# Patient Record
Sex: Female | Born: 2000 | Race: Black or African American | Hispanic: No | Smoking: Never smoker
Health system: Southern US, Community
[De-identification: ages and names within clinical notes are randomized; demographics above are authoritative.]

## PROBLEM LIST (undated history)

## (undated) ENCOUNTER — Inpatient Hospital Stay: Payer: Self-pay

## (undated) DIAGNOSIS — Z789 Other specified health status: Secondary | ICD-10-CM

---
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
## 2019-09-23 ENCOUNTER — Encounter: Payer: Self-pay | Admitting: Intensive Care

## 2019-09-23 ENCOUNTER — Emergency Department
Admission: EM | Admit: 2019-09-23 | Discharge: 2019-09-23 | Disposition: A | Discharge status: Home / Self Care | Payer: Medicaid Other | Attending: Emergency Medicine | Admitting: Emergency Medicine

## 2019-09-23 ENCOUNTER — Emergency Department: Payer: Medicaid Other

## 2019-09-23 ENCOUNTER — Other Ambulatory Visit: Payer: Self-pay

## 2019-09-23 DIAGNOSIS — M79672 Pain in left foot: Secondary | ICD-10-CM | POA: Diagnosis not present

## 2019-09-23 DIAGNOSIS — M79605 Pain in left leg: Secondary | ICD-10-CM

## 2019-09-23 DIAGNOSIS — R2242 Localized swelling, mass and lump, left lower limb: Secondary | ICD-10-CM | POA: Insufficient documentation

## 2019-09-23 LAB — COMPREHENSIVE METABOLIC PANEL
ALT: 14 U/L (ref 0–44)
AST: 12 U/L — ABNORMAL LOW (ref 15–41)
Albumin: 4.1 g/dL (ref 3.5–5.0)
Alkaline Phosphatase: 32 U/L — ABNORMAL LOW (ref 38–126)
Anion gap: 5 (ref 5–15)
BUN: 19 mg/dL (ref 6–20)
CO2: 26 mmol/L (ref 22–32)
Calcium: 9 mg/dL (ref 8.9–10.3)
Chloride: 105 mmol/L (ref 98–111)
Creatinine, Ser: 0.72 mg/dL (ref 0.44–1.00)
GFR calc Af Amer: >60 mL/min (ref >60)
GFR calc non Af Amer: >60 mL/min (ref >60)
Glucose, Bld: 100 mg/dL — ABNORMAL HIGH (ref 70–99)
Potassium: 3.7 mmol/L (ref 3.5–5.1)
Sodium: 136 mmol/L (ref 135–145)
Total Bilirubin: 0.5 mg/dL (ref 0.3–1.2)
Total Protein: 6.9 g/dL (ref 6.5–8.1)

## 2019-09-23 LAB — CBC WITH DIFFERENTIAL/PLATELET
Abs Immature Granulocytes: 0.02 10*3/uL (ref 0.00–0.07)
Basophils Absolute: 0.1 10*3/uL (ref 0.0–0.1)
Basophils Relative: 1 %
Eosinophils Absolute: 0.5 10*3/uL (ref 0.0–0.5)
Eosinophils Relative: 7 %
HCT: 35.3 % — ABNORMAL LOW (ref 36.0–46.0)
Hemoglobin: 11.9 g/dL — ABNORMAL LOW (ref 12.0–15.0)
Immature Granulocytes: 0 %
Lymphocytes Relative: 36 %
Lymphs Abs: 2.5 10*3/uL (ref 0.7–4.0)
MCH: 31.2 pg (ref 26.0–34.0)
MCHC: 33.7 g/dL (ref 30.0–36.0)
MCV: 92.7 fL (ref 80.0–100.0)
Monocytes Absolute: 0.6 10*3/uL (ref 0.1–1.0)
Monocytes Relative: 9 %
Neutro Abs: 3.3 10*3/uL (ref 1.7–7.7)
Neutrophils Relative %: 47 %
Platelets: 183 10*3/uL (ref 150–400)
RBC: 3.81 MIL/uL — ABNORMAL LOW (ref 3.87–5.11)
RDW: 12.7 % (ref 11.5–15.5)
WBC: 6.8 10*3/uL (ref 4.0–10.5)
nRBC: 0 % (ref 0.0–0.2)

## 2019-09-23 LAB — POCT PREGNANCY, URINE: Preg Test, Ur: NEGATIVE

## 2019-09-23 MED ORDER — CEPHALEXIN 250 MG/5ML PO SUSR: 500.0000 mg | Freq: Three times a day (TID) | ORAL | 0 refills | Status: AC

## 2019-09-23 NOTE — ED Provider Notes (Signed)
[facility] Emergency Department Provider Note  ____________________________________________   First MD Initiated Contact with Patient 09/23/19 1720     (approximate)  I have reviewed the triage vital signs and the nursing notes.   HISTORY  Chief Complaint Foot Pain (left)    HPI Talaysha Sabrina Alvarado is a 19 y.o. female presents emergency department complaining of left foot pain for a few days.  States areas been very swollen.  No known injury.  No sores.  No fever or chills.  Patient does take birth control pills.  Non-smoker.   Pain is rated at 5/10   History reviewed. No pertinent past medical history.  There are no problems to display for this patient.   History reviewed. No pertinent surgical history.  Prior to Admission medications   Not on File    Allergies Patient has no known allergies.  Family History  Problem Relation Age of Onset  . Scoliosis Mother   . Healthy Father     Social History Social History   Tobacco Use  . Smoking status: Never Smoker  . Smokeless tobacco: Never Used  Vaping Use  . Vaping Use: Never used  Substance Use Topics  . Alcohol use: Never  . Drug use: Never    Review of Systems  Constitutional: No fever/chills Eyes: No visual changes. ENT: No sore throat. Respiratory: Denies cough Cardiovascular: Denies chest pain Gastrointestinal: Denies abdominal pain Genitourinary: Negative for dysuria. Musculoskeletal: Negative for back pain.  Swelling of the left foot Skin: Negative for rash. Psychiatric: no mood changes,     ____________________________________________   PHYSICAL EXAM:  VITAL SIGNS: ED Triage Vitals  Enc Vitals Group     BP 09/23/19 1704 118/75     Pulse Rate 09/23/19 1704 92     Resp 09/23/19 1704 20     Temp 09/23/19 1704 98.5 F (36.9 C)     Temp Source 09/23/19 1704 Oral     SpO2 09/23/19 1704 100 %     Weight 09/23/19 1704 90 lb (40.8 kg)     Height 09/23/19 1704 5\' 3"   (1.6 m)     Head Circumference --      Peak Flow --      Pain Score 09/23/19 1710 5     Pain Loc --      Pain Edu? --      Excl. in GC? --     Constitutional: Alert and oriented. Well appearing and in no acute distress. Eyes: Conjunctivae are normal.  Head: Atraumatic. Nose: No congestion/rhinnorhea. Mouth/Throat: Mucous membranes are moist.   Neck:  supple no lymphadenopathy noted Cardiovascular: Normal rate, regular rhythm.  Respiratory: Normal respiratory effort.  No retractions,  GU: deferred Musculoskeletal: FROM all extremities, warm and well perfused, left foot has soft tissue swelling, some redness noted along the top with a mild streak coming up the lower leg, neurovascular is intact Neurologic:  Normal speech and language.  Skin:  Skin is warm, dry and intact. No rash noted. Psychiatric: Mood and affect are normal. Speech and behavior are normal.  ____________________________________________   LABS (all labs ordered are listed, but only abnormal results are displayed)  Labs Reviewed  POC URINE PREG, ED   ____________________________________________   ____________________________________________  RADIOLOGY  X-ray of the left foot is negative for fracture, foreign body, does show soft tissue swelling Ultrasound of the left lower extremity to rule out DVT  ____________________________________________   PROCEDURES  Procedure(s) performed: No  Procedures  ____________________________________________   INITIAL IMPRESSION / ASSESSMENT AND PLAN / ED COURSE  Pertinent labs & imaging results that were available during my care of the patient were reviewed by me and considered in my medical decision making (see chart for details).   Patient is a 19 year old female presents emergency department with left foot pain and swelling.  See HPI Physical exam does show soft tissue swelling with the fair amount of redness of the left foot with a small streak.  Calf is  not tender, great saphenous vein area of the left upper thigh is tender  Patient's x-ray of her left foot is negative for fracture or foreign body.  It does show soft tissue swelling  Ultrasound venous left lower extremity  Patient care transferred to [facility], PA-C.  She was instructed that the ultrasound will be performed.  If negative treat the patient for cellulitis.   Chanon Loney was evaluated in Emergency Department on 09/23/2019 for the symptoms described in the history of present illness. She was evaluated in the context of the global COVID-19 pandemic, which necessitated consideration that the patient might be at risk for infection with the SARS-CoV-2 virus that causes COVID-19. Institutional protocols and algorithms that pertain to the evaluation of patients at risk for COVID-19 are in a state of rapid change based on information released by regulatory bodies including the CDC and federal and state organizations. These policies and algorithms were followed during the patient's care in the ED.   As part of my medical decision making, I reviewed the following data within the [place] History obtained from family, Nursing notes reviewed and incorporated, Old chart reviewed, Radiograph reviewed , Notes from prior ED visits and [place] Controlled Substance Database  ____________________________________________   FINAL CLINICAL IMPRESSION(S) / ED DIAGNOSES  Final diagnoses:  Foot pain, left  Pain of left lower extremity      NEW MEDICATIONS STARTED DURING THIS VISIT:  New Prescriptions   No medications on file     Note:  This document was prepared using Dragon voice recognition software and may include unintentional dictation errors.    Versie Starks, PA-C 09/23/19 Arther Dames, MD 09/25/19 Natasha Mead

## 2019-09-23 NOTE — ED Triage Notes (Signed)
PAtient c/o left foot pain for a few days. Denies injury

## 2019-09-23 NOTE — Discharge Instructions (Signed)
Take Keflex three times daily for the next week.  

## 2021-05-16 ENCOUNTER — Ambulatory Visit
Admission: EM | Admit: 2021-05-16 | Discharge: 2021-05-16 | Disposition: A | Discharge status: Home / Self Care | Payer: Medicaid Other

## 2021-05-16 ENCOUNTER — Encounter: Payer: Self-pay | Admitting: Emergency Medicine

## 2021-05-16 ENCOUNTER — Other Ambulatory Visit: Payer: Self-pay

## 2021-05-16 ENCOUNTER — Ambulatory Visit (INDEPENDENT_AMBULATORY_CARE_PROVIDER_SITE_OTHER): Payer: Medicaid Other

## 2021-05-16 DIAGNOSIS — S161XXA Strain of muscle, fascia and tendon at neck level, initial encounter: Secondary | ICD-10-CM | POA: Diagnosis not present

## 2021-05-16 DIAGNOSIS — S8001XA Contusion of right knee, initial encounter: Secondary | ICD-10-CM

## 2021-05-16 DIAGNOSIS — R519 Headache, unspecified: Secondary | ICD-10-CM

## 2021-05-16 DIAGNOSIS — S29019A Strain of muscle and tendon of unspecified wall of thorax, initial encounter: Secondary | ICD-10-CM

## 2021-05-16 DIAGNOSIS — S39012A Strain of muscle, fascia and tendon of lower back, initial encounter: Secondary | ICD-10-CM | POA: Diagnosis not present

## 2021-05-16 MED ORDER — IBUPROFEN 600 MG PO TABS: 600.0000 mg | ORAL_TABLET | Freq: Four times a day (QID) | ORAL | 0 refills | Status: DC | PRN

## 2021-05-16 MED ORDER — BACLOFEN 10 MG PO TABS: 10.0000 mg | ORAL_TABLET | Freq: Three times a day (TID) | ORAL | 0 refills | Status: DC

## 2021-05-16 NOTE — Discharge Instructions (Addendum)
Take the ibuprofen, 600 mg every 6 hours with food, on a schedule for the next 48 hours and then as needed.  Take the baclofen, 10 mg every 8 hours, on a schedule for the next 48 hours and then as needed.  Apply moist heat to your back/neck for 30 minutes at a time 2-3 times a day to improve blood flow to the area and help remove the lactic acid causing the spasm.  Follow the back/neck exercises given at discharge.  The ibuprofen should also help your headache and knee pain.  Return for reevaluation for any new or worsening symptoms.
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
## 2022-08-28 ENCOUNTER — Ambulatory Visit
Admission: RE | Admit: 2022-08-28 | Discharge: 2022-08-28 | Disposition: A | Discharge status: Home / Self Care | Payer: Medicaid Other | Source: Ambulatory Visit | Attending: Nurse Practitioner | Admitting: Nurse Practitioner

## 2022-08-28 ENCOUNTER — Other Ambulatory Visit: Payer: Self-pay

## 2022-08-28 DIAGNOSIS — M79642 Pain in left hand: Secondary | ICD-10-CM | POA: Diagnosis present

## 2023-02-14 ENCOUNTER — Other Ambulatory Visit: Payer: Self-pay

## 2023-02-14 ENCOUNTER — Ambulatory Visit: Payer: Self-pay

## 2023-02-14 ENCOUNTER — Emergency Department
Admission: EM | Admit: 2023-02-14 | Discharge: 2023-02-14 | Disposition: A | Discharge status: Home / Self Care | Payer: Medicaid Other | Attending: Emergency Medicine | Admitting: Emergency Medicine

## 2023-02-14 DIAGNOSIS — N939 Abnormal uterine and vaginal bleeding, unspecified: Secondary | ICD-10-CM | POA: Diagnosis present

## 2023-02-14 DIAGNOSIS — N750 Cyst of Bartholin's gland: Secondary | ICD-10-CM | POA: Insufficient documentation

## 2023-02-14 LAB — URINALYSIS, ROUTINE W REFLEX MICROSCOPIC
Bilirubin Urine: NEGATIVE
Glucose, UA: NEGATIVE mg/dL
Ketones, ur: 80 mg/dL — AB
Nitrite: NEGATIVE
Protein, ur: 100 mg/dL — AB
Specific Gravity, Urine: 1.031 — ABNORMAL HIGH (ref 1.005–1.030)
pH: 5 (ref 5.0–8.0)

## 2023-02-14 LAB — BASIC METABOLIC PANEL
Anion gap: 8 (ref 5–15)
BUN: 17 mg/dL (ref 6–20)
CO2: 25 mmol/L (ref 22–32)
Calcium: 9.1 mg/dL (ref 8.9–10.3)
Chloride: 101 mmol/L (ref 98–111)
Creatinine, Ser: 0.66 mg/dL (ref 0.44–1.00)
GFR, Estimated: >60 mL/min (ref >60)
Glucose, Bld: 117 mg/dL — ABNORMAL HIGH (ref 70–99)
Potassium: 3.8 mmol/L (ref 3.5–5.1)
Sodium: 134 mmol/L — ABNORMAL LOW (ref 135–145)

## 2023-02-14 LAB — CBC WITH DIFFERENTIAL/PLATELET
Abs Immature Granulocytes: 0.09 10*3/uL — ABNORMAL HIGH (ref 0.00–0.07)
Basophils Absolute: 0 10*3/uL (ref 0.0–0.1)
Basophils Relative: 0 %
Eosinophils Absolute: 0 10*3/uL (ref 0.0–0.5)
Eosinophils Relative: 0 %
HCT: 38.6 % (ref 36.0–46.0)
Hemoglobin: 13 g/dL (ref 12.0–15.0)
Immature Granulocytes: 1 %
Lymphocytes Relative: 6 %
Lymphs Abs: 1.1 10*3/uL (ref 0.7–4.0)
MCH: 30.9 pg (ref 26.0–34.0)
MCHC: 33.7 g/dL (ref 30.0–36.0)
MCV: 91.7 fL (ref 80.0–100.0)
Monocytes Absolute: 1.3 10*3/uL — ABNORMAL HIGH (ref 0.1–1.0)
Monocytes Relative: 8 %
Neutro Abs: 14 10*3/uL — ABNORMAL HIGH (ref 1.7–7.7)
Neutrophils Relative %: 85 %
Platelets: 206 10*3/uL (ref 150–400)
RBC: 4.21 MIL/uL (ref 3.87–5.11)
RDW: 12.4 % (ref 11.5–15.5)
WBC: 16.5 10*3/uL — ABNORMAL HIGH (ref 4.0–10.5)
nRBC: 0 % (ref 0.0–0.2)

## 2023-02-14 LAB — CHLAMYDIA/NGC RT PCR (ARMC ONLY)
Chlamydia Tr: NOT DETECTED
N gonorrhoeae: NOT DETECTED

## 2023-02-14 LAB — WET PREP, GENITAL
Clue Cells Wet Prep HPF POC: NONE SEEN
Sperm: NONE SEEN
Trich, Wet Prep: NONE SEEN
WBC, Wet Prep HPF POC: <10 (ref <10)
Yeast Wet Prep HPF POC: NONE SEEN

## 2023-02-14 LAB — POC URINE PREG, ED: Preg Test, Ur: NEGATIVE

## 2023-02-14 MED ORDER — DOXYCYCLINE HYCLATE 100 MG PO TABS
100.0000 mg | ORAL_TABLET | Freq: Once | ORAL | Status: AC
  Administered 2023-02-14: 100 mg via ORAL
  Filled 2023-02-14: qty 1

## 2023-02-14 MED ORDER — CEFTRIAXONE SODIUM 1 G IJ SOLR
500.0000 mg | Freq: Once | INTRAMUSCULAR | Status: AC
Start: 2023-02-14 — End: 2023-02-14
  Administered 2023-02-14: 500 mg via INTRAMUSCULAR
  Filled 2023-02-14: qty 10

## 2023-02-14 MED ORDER — DOXYCYCLINE HYCLATE 50 MG PO CAPS: 100.0000 mg | ORAL_CAPSULE | Freq: Two times a day (BID) | ORAL | 0 refills | Status: AC

## 2023-02-14 NOTE — ED Provider Notes (Signed)
[facility] Provider Note    Event Date/Time   First MD Initiated Contact with Patient 02/14/23 1210     (approximate)   History   Vaginal Bleeding   HPI Sabrina Alvarado is a 22 y.o. female presenting today for vaginal bleeding and swelling.  Patient states she had sexual intercourse several days ago.  She started noticing swelling and pain around her vagina 2 days ago.  Today she noticed an episode of vaginal bleeding which quickly resolved.  Last menstrual cycle ended 7 days ago.  Denies any other abdominal pain, nausea, vomiting, vaginal discharge, dysuria, hematuria.  Does not use oral contraceptives or condoms.  No prior history of STIs.     Physical Exam   Triage Vital Signs: ED Triage Vitals [02/14/23 1156]  Encounter Vitals Group     BP 116/86     Systolic BP Percentile      Diastolic BP Percentile      Pulse Rate (!) 110     Resp 18     Temp 98 F (36.7 C)     Temp src      SpO2 100 %     Weight 93 lb (42.2 kg)     Height 5\' 4"  (1.626 m)     Head Circumference      Peak Flow      Pain Score 0     Pain Loc      Pain Education      Exclude from Growth Chart     Most recent vital signs: Vitals:   02/14/23 1156  BP: 116/86  Pulse: (!) 110  Resp: 18  Temp: 98 F (36.7 C)  SpO2: 100%   I have reviewed the vital signs. General:  Awake, alert, no acute distress. Head:  Normocephalic, Atraumatic. EENT:  PERRL, EOMI, Oral mucosa pink and moist, Neck is supple. Cardiovascular: Regular rate, 2+ distal pulses. Respiratory:  Normal respiratory effort, symmetrical expansion, no distress.   Abdomen: Soft, nontender, non-distended GU: Pelvic exam showing swelling to the right labia.  Slight cystic structure noted that actively drained on exam.  Noted slight bruising to the right labia as well.  No other intravaginal trauma noted.  No other vaginal bleeding present. Extremities:  Moving all four extremities through full ROM without pain.    Neuro:  Alert and oriented.  Interacting appropriately.   Skin:  Warm, dry, no rash.   Psych: Appropriate affect.    ED Results / Procedures / Treatments   Labs (all labs ordered are listed, but only abnormal results are displayed) Labs Reviewed  CBC WITH DIFFERENTIAL/PLATELET - Abnormal; Notable for the following components:      Result Value   WBC 16.5 (*)    Neutro Abs 14.0 (*)    Monocytes Absolute 1.3 (*)    Abs Immature Granulocytes 0.09 (*)    All other components within normal limits  BASIC METABOLIC PANEL - Abnormal; Notable for the following components:   Sodium 134 (*)    Glucose, Bld 117 (*)    All other components within normal limits  URINALYSIS, ROUTINE W REFLEX MICROSCOPIC - Abnormal; Notable for the following components:   Color, Urine YELLOW (*)    APPearance HAZY (*)    Specific Gravity, Urine 1.031 (*)    Hgb urine dipstick MODERATE (*)    Ketones, ur 80 (*)    Protein, ur 100 (*)    Leukocytes,Ua MODERATE (*)    Bacteria, UA RARE (*)  All other components within normal limits  WET PREP, GENITAL  CHLAMYDIA/NGC RT PCR ([facility] ONLY)            POC URINE PREG, ED     EKG    RADIOLOGY    PROCEDURES:  Critical Care performed: No  Pelvic exam  Date/Time: 02/14/2023 1:17 PM  Performed by: Janith Lima, MD Authorized by: Janith Lima, MD  Consent: Verbal consent obtained. Risks and benefits: risks, benefits and alternatives were discussed Consent given by: patient Patient identity confirmed: verbally with patient Time out: Immediately prior to procedure a "time out" was called to verify the correct patient, procedure, equipment, support staff and site/side marked as required. Preparation: Patient was prepped and draped in the usual sterile fashion. Local anesthesia used: no  Anesthesia: Local anesthesia used: no Patient tolerance: patient tolerated the procedure well with no immediate complications Comments: See physical exam for  findings on pelvic exam      MEDICATIONS ORDERED IN ED: Medications  cefTRIAXone (ROCEPHIN) injection 500 mg (has no administration in time range)  doxycycline (VIBRA-TABS) tablet 100 mg (has no administration in time range)     IMPRESSION / MDM / ASSESSMENT AND PLAN / ED COURSE  I reviewed the triage vital signs and the nursing notes.                              Differential diagnosis includes, but is not limited to, UTI, Bartholin cyst, vaginal trauma, vaginal laceration  Patient's presentation is most consistent with acute complicated illness / injury requiring diagnostic workup.  Patient is a 22 year old female presenting today for vaginal trauma and bleeding.  Swelling noted to the right labial region with bruising present.  There was a small cyst which was actively draining on pelvic exam.  No other intravaginal trauma noted.  I do think swelling is likely related to recent vaginal trauma.  No bleeding on the exam at this time.  Pregnancy test negative.  Will empirically treat for STIs as patient does not want to wait for those results.  She was told to follow-up with her gynecologist within the next 5 days for reassessment and potential further management of cystic structure is getting larger again after it has been draining appropriately today.  The patient is on the cardiac monitor to evaluate for evidence of arrhythmia and/or significant heart rate changes. Clinical Course as of 02/14/23 1344  Sun Feb 14, 2023  1342 Urinalysis, Routine w reflex microscopic -Urine, Clean Catch(!) Many squames present along with blood and drainage coming from the vagina.  Will not treat for UTI at this time. [DW]    Clinical Course User Index [DW] Janith Lima, MD     FINAL CLINICAL IMPRESSION(S) / ED DIAGNOSES   Final diagnoses:  Bartholin cyst     Rx / DC Orders   ED Discharge Orders          Ordered    Ambulatory referral to Gynecology       Comments: Vaginal trauma.   Possible Bartholin cyst   02/14/23 1343    doxycycline (VIBRAMYCIN) 50 MG capsule  2 times daily        02/14/23 1344             Note:  This document was prepared using Dragon voice recognition software and may include unintentional dictation errors.   Janith Lima, MD 02/14/23 [id number]

## 2023-02-14 NOTE — Discharge Instructions (Addendum)
I have sent antibiotics to your pharmacy for you to take as prescribed.  Please follow-up with a gynecologist within the next 5 days for reassessment.

## 2023-02-14 NOTE — ED Triage Notes (Signed)
Pt comes with c/o some vaginal bleeding. Pt stats she did have intercourse 2 days ago. Pt states it has since been swollen down there and painful. Pt states the bleeding has stopped and pain has subsided. Pt denies any urinary symptoms.

## 2023-04-07 NOTE — L&D Delivery Note (Signed)
 Delivery Note  Sabrina Alvarado is a G3P0020 at [redacted]w[redacted]d with an LMP of 03/11/23, not consistent with US  at [redacted]w[redacted]d.   First Stage: Labor onset: 12/05/23 @ 1918 Augmentation: oxytocin  and AROM Analgesia Holli intrapartum: Epidural Date/time: 12/06/23 @ 1110, Amount: large gush, and Color: clear GBS: POSITIVE/-- (08/31 1723)  IP Antibiotics: abx: PCN x 3 doses  Second Stage: Complete dilation at 1556 Onset of pushing at 1402 FHR second stage Baseline: 145 bpm with variable decels with pushing   Sabrina Alvarado presented to L&D for active labor She was 1/90/-2. She progressed  to C/C/+2 with a spontaneous urge to push.  She pushed  effectively over approximately 59 minutes for a spontaneous vaginal birth. Delivery of a viable baby boy on 12/06/2023 . by CNM. Delivery of fetal head in position: Occiput,, Anterior position with restitution to position: Left,, Occiput,, Transverse no nuchal cord;  Anterior then posterior shoulders delivered easily with gentle downward traction. Baby placed on mom's chest, and attended to by baby RN. Cord double clamped after cessation of pulsation, cut by FOB    Third Stage: Oxytocin  bolus started after delivery of infant for hemorrhage prophylaxis  Placenta delivered [facility] intact with 3 VC @ 1709 Placenta disposition: To Pathology: No  Uterine tone firm / exam; vaginal bleeding: minimal  Laceration: 1st degree and labial laceration identified  Anesthesia for repair: procedures; anesthesia: epidural Repair suture type: 4.0 Vicyl Est. Blood Loss (mL): 75  Complications:Diagnoses; OB-GYN delivery complications: maternal temperature  Mom to postpartum.  Baby to Couplet care / Skin to Skin.  Newborn: Information for the patient's newborn:  Sabrina, Alvarado [[id number]]  Live born female [place] Birth Weight:   APGAR: 8, 9  Newborn Delivery   Birth date/time: 12/06/2023 17:02:00 Delivery type: Vaginal, Spontaneous      Feeding planned: formula  feeding  ---------- Bobbette Brunswick, CNM Certified Nurse Midwife [place]  [facility]

## 2023-05-28 DIAGNOSIS — O0992 Supervision of high risk pregnancy, unspecified, second trimester: Secondary | ICD-10-CM | POA: Insufficient documentation

## 2023-06-08 LAB — OB RESULTS CONSOLE VARICELLA ZOSTER ANTIBODY, IGG: Varicella: IMMUNE

## 2023-06-08 LAB — OB RESULTS CONSOLE RUBELLA ANTIBODY, IGM: Rubella: IMMUNE

## 2023-06-08 LAB — OB RESULTS CONSOLE HIV ANTIBODY (ROUTINE TESTING): HIV: NONREACTIVE

## 2023-06-08 LAB — OB RESULTS CONSOLE HEPATITIS B SURFACE ANTIGEN: Hepatitis B Surface Ag: NEGATIVE

## 2023-07-06 DIAGNOSIS — R636 Underweight: Secondary | ICD-10-CM | POA: Insufficient documentation

## 2023-07-06 DIAGNOSIS — Z681 Body mass index (BMI) 19 or less, adult: Secondary | ICD-10-CM | POA: Insufficient documentation
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
## 2023-10-13 LAB — OB RESULTS CONSOLE RPR: RPR: NONREACTIVE

## 2023-12-04 ENCOUNTER — Encounter: Payer: Self-pay | Admitting: Obstetrics and Gynecology

## 2023-12-04 ENCOUNTER — Other Ambulatory Visit: Payer: Self-pay

## 2023-12-04 ENCOUNTER — Observation Stay
Admission: EM | Admit: 2023-12-04 | Discharge: 2023-12-04 | Disposition: A | Discharge status: Home / Self Care | Attending: Certified Nurse Midwife | Admitting: Obstetrics and Gynecology

## 2023-12-04 DIAGNOSIS — O2613 Low weight gain in pregnancy, third trimester: Secondary | ICD-10-CM | POA: Insufficient documentation

## 2023-12-04 DIAGNOSIS — O4690 Antepartum hemorrhage, unspecified, unspecified trimester: Principal | ICD-10-CM | POA: Diagnosis present

## 2023-12-04 DIAGNOSIS — O23513 Infections of cervix in pregnancy, third trimester: Principal | ICD-10-CM | POA: Insufficient documentation

## 2023-12-04 DIAGNOSIS — O368330 Maternal care for abnormalities of the fetal heart rate or rhythm, third trimester, not applicable or unspecified: Secondary | ICD-10-CM | POA: Insufficient documentation

## 2023-12-04 DIAGNOSIS — O99013 Anemia complicating pregnancy, third trimester: Secondary | ICD-10-CM | POA: Insufficient documentation

## 2023-12-04 DIAGNOSIS — D649 Anemia, unspecified: Secondary | ICD-10-CM | POA: Insufficient documentation

## 2023-12-04 DIAGNOSIS — Z79899 Other long term (current) drug therapy: Secondary | ICD-10-CM | POA: Insufficient documentation

## 2023-12-04 DIAGNOSIS — Z3A36 36 weeks gestation of pregnancy: Secondary | ICD-10-CM | POA: Insufficient documentation

## 2023-12-04 DIAGNOSIS — O4703 False labor before 37 completed weeks of gestation, third trimester: Secondary | ICD-10-CM | POA: Insufficient documentation

## 2023-12-04 LAB — WET PREP, GENITAL
Clue Cells Wet Prep HPF POC: NONE SEEN
Sperm: NONE SEEN
Trich, Wet Prep: NONE SEEN
WBC, Wet Prep HPF POC: >=10 — AB (ref <10)
Yeast Wet Prep HPF POC: NONE SEEN

## 2023-12-04 LAB — URINALYSIS, ROUTINE W REFLEX MICROSCOPIC
Bacteria, UA: NONE SEEN
Bilirubin Urine: NEGATIVE
Glucose, UA: NEGATIVE mg/dL
Ketones, ur: 5 mg/dL — AB
Nitrite: NEGATIVE
Protein, ur: NEGATIVE mg/dL
Specific Gravity, Urine: 1.014 (ref 1.005–1.030)
pH: 6 (ref 5.0–8.0)

## 2023-12-04 MED ORDER — DOCUSATE SODIUM 100 MG PO CAPS: 100.0000 mg | ORAL_CAPSULE | Freq: Every day | ORAL | Status: DC

## 2023-12-04 MED ORDER — ONDANSETRON 4 MG PO TBDP: 4.0000 mg | ORAL_TABLET | Freq: Once | ORAL | Status: DC

## 2023-12-04 MED ORDER — CALCIUM CARBONATE ANTACID 500 MG PO CHEW: 2.0000 | CHEWABLE_TABLET | ORAL | Status: DC | PRN

## 2023-12-04 MED ORDER — ACETAMINOPHEN 325 MG PO TABS: 650.0000 mg | ORAL_TABLET | ORAL | Status: DC | PRN

## 2023-12-04 MED ORDER — AZITHROMYCIN 1 G PO PACK
1.0000 g | PACK | Freq: Once | ORAL | Status: DC
  Filled 2023-12-04: qty 1

## 2023-12-04 MED ORDER — TERBUTALINE SULFATE 1 MG/ML IJ SOLN
0.2500 mg | INTRAMUSCULAR | Status: AC
  Administered 2023-12-04: 0.25 mg via SUBCUTANEOUS
  Filled 2023-12-04: qty 1

## 2023-12-04 MED ORDER — AZITHROMYCIN 500 MG PO TABS
1000.0000 mg | ORAL_TABLET | Freq: Once | ORAL | Status: AC
  Administered 2023-12-04: 1000 mg via ORAL
  Filled 2023-12-04: qty 2

## 2023-12-04 MED ORDER — ZOLPIDEM TARTRATE 5 MG PO TABS: 5.0000 mg | ORAL_TABLET | Freq: Every evening | ORAL | Status: DC | PRN

## 2023-12-04 MED ORDER — ONDANSETRON 4 MG PO TBDP: 4.0000 mg | ORAL_TABLET | Freq: Four times a day (QID) | ORAL | Status: DC | PRN

## 2023-12-04 MED ORDER — PRENATAL MULTIVITAMIN CH
1.0000 | ORAL_TABLET | Freq: Every day | ORAL | Status: DC
  Filled 2023-12-04: qty 1

## 2023-12-04 MED ORDER — LACTATED RINGERS IV SOLN: Freq: Once | INTRAVENOUS | Status: AC

## 2023-12-04 NOTE — OB Triage Note (Signed)
 Patient arrived in triage with c/o n/v all day today. Patient reports this is common for her during this entire pregnancy. She came in because she noticed pink tinged spotting on liner and in the toilet after going to the bathroom. No blood noted on pad that she wore into hospital per patient. Reports good fetal movement. Denies leaking of fluid consistent with rupture of membranes. Denies contractions, just feeling some lower abdominal pressure that comes and goes. EFM applied and assessing. Initial FHT 130s.

## 2023-12-04 NOTE — Discharge Summary (Signed)
 Sabrina Alvarado is a 23 y.o. female. She is at [redacted]w[redacted]d gestation. Patient's last menstrual period was 03/11/2023 (exact date). Estimated Date of Delivery: 01/01/24  Prenatal care site: [facility]   Current pregnancy complicated by:  - anemia - underweight pre-pregnancy  Chief complaint: vaginal bleeding, nausea and vomiting, abdominal pain  She reports nausea and vomiting today. This afternoon she used the bathroom and noted some pinkish blood in the toilet and when she wiped. She is also experiencing intermittent abdominal pain that comes and goes. She denies leaking fluid, regular contractions, abnormal vaginal discharge, itching, or burning.  S: Resting comfortably. no CTX, no LOF,  Active fetal movement.  Denies: HA, visual changes, SOB, or RUQ/epigastric pain  Maternal Medical History:  No past medical history on file.  No past surgical history on file.  No Known Allergies  Prior to Admission medications   Medication Sig Start Date End Date Taking? Authorizing Provider  Prenatal Vit-Fe Fumarate-FA (MULTIVITAMIN-PRENATAL) 27-0.8 MG TABS tablet Take 1 tablet by mouth daily at 12 noon.   Yes [provider]  baclofen  (LIORESAL ) 10 MG tablet Take 1 tablet (10 mg total) by mouth 3 (three) times daily. Patient not taking: Reported on 12/04/2023 05/16/21   Bernardino Ditch, NP  ibuprofen  (ADVIL ) 600 MG tablet Take 1 tablet (600 mg total) by mouth every 6 (six) hours as needed. Patient not taking: Reported on 12/04/2023 05/16/21   Bernardino Ditch, NP  norethindrone-ethinyl estradiol (LOESTRIN) 1-20 MG-MCG tablet Take by mouth. Patient not taking: Reported on 12/04/2023 02/20/21   [provider]    Social History: She  reports that she has never smoked. She has never used smokeless tobacco. She reports that she does not drink alcohol and does not use drugs.  Family History: family history includes Healthy in her father; Scoliosis in her mother.  no history of gyn  cancers  Review of Systems: A full review of systems was performed and negative except as noted in the HPI.    O:  BP 114/68 (BP Location: Left Arm)   Pulse 83   Temp 98.1 F (36.7 C) (Oral)   Resp 16   LMP 03/11/2023 (Exact Date)  Results for orders placed or performed during the hospital encounter of 12/04/23 (from the past 48 hours)  Wet prep, genital   Collection Time: 12/04/23  5:42 PM  Result Value Ref Range   Yeast Wet Prep HPF POC NONE SEEN NONE SEEN   Trich, Wet Prep NONE SEEN NONE SEEN   Clue Cells Wet Prep HPF POC NONE SEEN NONE SEEN   WBC, Wet Prep HPF POC >=10 (A) <10   Sperm NONE SEEN   Urinalysis, Routine w reflex microscopic -Urine, Clean Catch   Collection Time: 12/04/23  5:42 PM  Result Value Ref Range   Color, Urine YELLOW (A) YELLOW   APPearance CLEAR (A) CLEAR   Specific Gravity, Urine 1.014 1.005 - 1.030   pH 6.0 5.0 - 8.0   Glucose, UA NEGATIVE NEGATIVE mg/dL   Hgb urine dipstick MODERATE (A) NEGATIVE   Bilirubin Urine NEGATIVE NEGATIVE   Ketones, ur 5 (A) NEGATIVE mg/dL   Protein, ur NEGATIVE NEGATIVE mg/dL   Nitrite NEGATIVE NEGATIVE   Leukocytes,Ua SMALL (A) NEGATIVE   RBC / HPF 0-5 0 - 5 RBC/hpf   WBC, UA 11-20 0 - 5 WBC/hpf   Bacteria, UA NONE SEEN NONE SEEN   Squamous Epithelial / HPF 0-5 0 - 5 /HPF   Mucus PRESENT  Constitutional: NAD, AAOx3  HE/ENT: extraocular movements grossly intact, moist mucous membranes CV: RRR PULM: nl respiratory effort, CTABL     Abd: gravid, non-tender, non-distended, soft      Ext: Non-tender, Nonedematous   Psych: mood appropriate, speech normal Pelvic: SSE done, cervix 1/90/-2, BOW noted  Pelvic exam: normal external genitalia, vulva, vagina, cervix, uterus and adnexa. Frothy reddish vaginal discharge noted  Fetal  monitoring: Cat 1 Appropriate for GA Baseline: 135bpm Variability: moderate Accelerations: present x >2 Decelerations absent Uterine contractions: q2-30min, palpate mild, patient is not  feeling them Time 3hrs  A/P: 23 y.o. [redacted]w[redacted]d here for antenatal surveillance for vaginal bleeding in pregnancy  Principle Diagnosis:  Cervicitis, preterm uterine contractions  Cervicitis: wet prep negative, treat for cervicitis. Given 1g Azithromycin  PO which she vomited up and refused to take more. Attempted to provide her with Zofran  then Azithromycin  liquid but patient refuses. Patient is aware that she should be treated but she refuses.  Labor: not present. She reported the contractions were becoming less strong. She received a dose of terbutaline  and stopped feeling the contractions. Her cervix was unchanged after 2hrs. Fetal Wellbeing: Reassuring Cat 1 tracing. Reactive NST  D/c home stable, precautions reviewed, follow-up as scheduled.    Edsel Charlies Blush, CNM 12/04/2023 10:31 PM

## 2023-12-04 NOTE — Progress Notes (Addendum)
 Pt given zithromax , crushed in apple sauce. Pt unable to keep medication down, emesis x1. Tanda, CNM notified. Orders to give zofran  first then try abx again. Pt declined zofran  and abx. Wilson, CNM ordered powder form of abx to encourage pt to take. Pt still declining further medications. Discharge instructions given with return precautions. Rosalynn Sergent L. Geofm, RN BSN 12/04/2023 8:28 PM

## 2023-12-05 ENCOUNTER — Inpatient Hospital Stay
Admission: EM | Admit: 2023-12-05 | Discharge: 2023-12-08 | DRG: 806 | Disposition: A | Discharge status: Home / Self Care | Attending: Obstetrics | Admitting: Obstetrics

## 2023-12-05 ENCOUNTER — Other Ambulatory Visit: Payer: Self-pay

## 2023-12-05 ENCOUNTER — Encounter: Payer: Self-pay | Admitting: Obstetrics and Gynecology

## 2023-12-05 DIAGNOSIS — K219 Gastro-esophageal reflux disease without esophagitis: Secondary | ICD-10-CM | POA: Diagnosis present

## 2023-12-05 DIAGNOSIS — O26893 Other specified pregnancy related conditions, third trimester: Secondary | ICD-10-CM | POA: Diagnosis present

## 2023-12-05 DIAGNOSIS — Z3A36 36 weeks gestation of pregnancy: Secondary | ICD-10-CM

## 2023-12-05 DIAGNOSIS — O99824 Streptococcus B carrier state complicating childbirth: Secondary | ICD-10-CM | POA: Diagnosis present

## 2023-12-05 DIAGNOSIS — O9902 Anemia complicating childbirth: Secondary | ICD-10-CM | POA: Diagnosis present

## 2023-12-05 DIAGNOSIS — O9962 Diseases of the digestive system complicating childbirth: Secondary | ICD-10-CM | POA: Diagnosis present

## 2023-12-05 DIAGNOSIS — O4700 False labor before 37 completed weeks of gestation, unspecified trimester: Principal | ICD-10-CM | POA: Diagnosis present

## 2023-12-05 HISTORY — DX: Other specified health status: Z78.9

## 2023-12-05 LAB — OB RESULTS CONSOLE GC/CHLAMYDIA
Chlamydia: NEGATIVE
Neisseria Gonorrhea: NEGATIVE

## 2023-12-05 LAB — CBC
HCT: 35.9 % — ABNORMAL LOW (ref 36.0–46.0)
Hemoglobin: 12.6 g/dL (ref 12.0–15.0)
MCH: 31.4 pg (ref 26.0–34.0)
MCHC: 35.1 g/dL (ref 30.0–36.0)
MCV: 89.5 fL (ref 80.0–100.0)
Platelets: 148 K/uL — ABNORMAL LOW (ref 150–400)
RBC: 4.01 MIL/uL (ref 3.87–5.11)
RDW: 12.8 % (ref 11.5–15.5)
WBC: 10.6 K/uL — ABNORMAL HIGH (ref 4.0–10.5)
nRBC: 0 % (ref 0.0–0.2)

## 2023-12-05 LAB — URINE DRUG SCREEN, QUALITATIVE (ARMC ONLY)
Amphetamines, Ur Screen: NOT DETECTED
Barbiturates, Ur Screen: NOT DETECTED
Benzodiazepine, Ur Scrn: NOT DETECTED
Cannabinoid 50 Ng, Ur ~~LOC~~: NOT DETECTED
Cocaine Metabolite,Ur ~~LOC~~: NOT DETECTED
MDMA (Ecstasy)Ur Screen: NOT DETECTED
Methadone Scn, Ur: NOT DETECTED
Opiate, Ur Screen: NOT DETECTED
Phencyclidine (PCP) Ur S: NOT DETECTED
Tricyclic, Ur Screen: NOT DETECTED

## 2023-12-05 LAB — CHLAMYDIA/NGC RT PCR (ARMC ONLY)
Chlamydia Tr: NOT DETECTED
N gonorrhoeae: NOT DETECTED

## 2023-12-05 LAB — ABO/RH: ABO/RH(D): A POS

## 2023-12-05 LAB — TYPE AND SCREEN
ABO/RH(D): A POS
Antibody Screen: NEGATIVE

## 2023-12-05 LAB — GROUP B STREP BY PCR: Group B strep by PCR: POSITIVE — AB

## 2023-12-05 MED ORDER — SODIUM CHLORIDE 0.9% FLUSH: 3.0000 mL | INTRAVENOUS | Status: DC | PRN

## 2023-12-05 MED ORDER — ACETAMINOPHEN 500 MG PO TABS: 1000.0000 mg | ORAL_TABLET | Freq: Four times a day (QID) | ORAL | Status: DC | PRN

## 2023-12-05 MED ORDER — LACTATED RINGERS IV SOLN: INTRAVENOUS | Status: DC

## 2023-12-05 MED ORDER — SODIUM CHLORIDE 0.9 % IV SOLN: 250.0000 mL | INTRAVENOUS | Status: DC | PRN

## 2023-12-05 MED ORDER — SODIUM CHLORIDE 0.9 % IV SOLN
5.0000 10*6.[IU] | Freq: Once | INTRAVENOUS | Status: AC
  Administered 2023-12-05: 5 10*6.[IU] via INTRAVENOUS
  Filled 2023-12-05: qty 5

## 2023-12-05 MED ORDER — CALCIUM CARBONATE ANTACID 500 MG PO CHEW
2.0000 | CHEWABLE_TABLET | ORAL | Status: DC | PRN
  Administered 2023-12-06: 400 mg via ORAL
  Filled 2023-12-05: qty 2

## 2023-12-05 MED ORDER — TERBUTALINE SULFATE 1 MG/ML IJ SOLN: 0.2500 mg | Freq: Once | INTRAMUSCULAR | Status: DC | PRN

## 2023-12-05 MED ORDER — PENICILLIN G POT IN DEXTROSE 60000 UNIT/ML IV SOLN
3.0000 10*6.[IU] | INTRAVENOUS | Status: DC
  Administered 2023-12-06 (×4): 3 10*6.[IU] via INTRAVENOUS
  Filled 2023-12-05 (×4): qty 50

## 2023-12-05 MED ORDER — LACTATED RINGERS IV BOLUS
1000.0000 mL | Freq: Once | INTRAVENOUS | Status: AC
  Administered 2023-12-05: 1000 mL via INTRAVENOUS

## 2023-12-05 MED ORDER — SODIUM CHLORIDE 0.9% FLUSH: 3.0000 mL | Freq: Two times a day (BID) | INTRAVENOUS | Status: DC

## 2023-12-05 MED ORDER — LIDOCAINE HCL (PF) 1 % IJ SOLN: 30.0000 mL | INTRAMUSCULAR | Status: DC | PRN

## 2023-12-05 MED ORDER — OXYTOCIN-SODIUM CHLORIDE 30-0.9 UT/500ML-% IV SOLN
2.5000 [IU]/h | INTRAVENOUS | Status: DC
  Filled 2023-12-05: qty 500

## 2023-12-05 MED ORDER — OXYTOCIN BOLUS FROM INFUSION
333.0000 mL | Freq: Once | INTRAVENOUS | Status: AC
  Administered 2023-12-06: 333 mL via INTRAVENOUS

## 2023-12-05 MED ORDER — ONDANSETRON HCL 4 MG/2ML IJ SOLN
4.0000 mg | Freq: Four times a day (QID) | INTRAMUSCULAR | Status: DC | PRN
  Administered 2023-12-06 (×2): 4 mg via INTRAVENOUS
  Filled 2023-12-05 (×2): qty 2

## 2023-12-05 MED ORDER — SOD CITRATE-CITRIC ACID 500-334 MG/5ML PO SOLN: 30.0000 mL | ORAL | Status: DC | PRN

## 2023-12-05 MED ORDER — LIDOCAINE HCL (PF) 1 % IJ SOLN
INTRAMUSCULAR | Status: AC
  Filled 2023-12-05: qty 30

## 2023-12-05 MED ORDER — LACTATED RINGERS IV SOLN
500.0000 mL | INTRAVENOUS | Status: DC | PRN
  Administered 2023-12-06: 500 mL via INTRAVENOUS

## 2023-12-05 MED ORDER — FENTANYL CITRATE (PF) 100 MCG/2ML IJ SOLN
50.0000 ug | INTRAMUSCULAR | Status: DC | PRN
  Administered 2023-12-05: 50 ug via INTRAVENOUS
  Administered 2023-12-05 (×2): 100 ug via INTRAVENOUS
  Filled 2023-12-05 (×3): qty 2

## 2023-12-05 MED ORDER — AMMONIA AROMATIC IN INHA
RESPIRATORY_TRACT | Status: AC
  Filled 2023-12-05: qty 10

## 2023-12-05 MED ORDER — OXYTOCIN 10 UNIT/ML IJ SOLN
INTRAMUSCULAR | Status: AC
  Filled 2023-12-05: qty 2

## 2023-12-05 MED ORDER — MISOPROSTOL 200 MCG PO TABS
ORAL_TABLET | ORAL | Status: AC
  Filled 2023-12-05: qty 4

## 2023-12-05 NOTE — H&P (Signed)
 OB History & Physical   History of Present Illness:   Chief Complaint: contractions   HPI:  Sabrina Alvarado is a 23 y.o. G93P0020 female at [redacted]w[redacted]d, Patient's last menstrual period was 03/11/2023 (exact date)., not consistent with US  at [redacted]w[redacted]d, with Estimated Date of Delivery: 01/01/24.  She presents to L&D for contractions that have gotten worse throughout the day. She was seen in [facility] triage on 12/04/2023 and was 1cm. She was discharged home after monitoring and no cervical change. She reports ongoing light pink spotting and contractions that became worse this morning.  Her pregnancy is complicated by anemia and low BMI.  She denies Loss of fluid. Endorses fetal movement as active.   Reports active fetal movement  Contractions: every 3 to 5 minutes LOF/SROM: denies  Vaginal bleeding: spotting   Factors complicating pregnancy:  Principal Problem:   Preterm contractions Active Problems:   Preterm labor    Prenatal care site:  [facility]  Patient Active Problem List   Diagnosis Date Noted   Preterm contractions 12/05/2023   Preterm labor 12/05/2023   Vaginal bleeding in pregnancy 12/04/2023   Underweight (BMI < 18.5) 07/06/2023   Supervision of high risk pregnancy in second trimester 05/28/2023      Maternal Diabetes: No Genetic Screening: Normal Maternal Ultrasounds/Referrals: Normal Fetal Ultrasounds or other Referrals:  None Maternal Substance Abuse:  No Significant Maternal Medications:  None Significant Maternal Lab Results:  Group B Strep positive Number of Prenatal Visits:greater than 3 verified prenatal visits Maternal Vaccinations:TDap Other Comments:  None   Maternal Medical History:   Past Medical History:  Diagnosis Date   Medical history non-contributory     History reviewed. No pertinent surgical history.  No Known Allergies  Prior to Admission medications   Medication Sig Start Date End Date Taking? Authorizing Provider  Prenatal Vit-Fe  Fumarate-FA (MULTIVITAMIN-PRENATAL) 27-0.8 MG TABS tablet Take 1 tablet by mouth daily at 12 noon.   Yes [provider]  baclofen  (LIORESAL ) 10 MG tablet Take 1 tablet (10 mg total) by mouth 3 (three) times daily. Patient not taking: Reported on 12/04/2023 05/16/21   Bernardino Ditch, NP  ibuprofen  (ADVIL ) 600 MG tablet Take 1 tablet (600 mg total) by mouth every 6 (six) hours as needed. Patient not taking: Reported on 12/04/2023 05/16/21   Bernardino Ditch, NP  norethindrone-ethinyl estradiol (LOESTRIN) 1-20 MG-MCG tablet Take by mouth. Patient not taking: Reported on 12/04/2023 02/20/21   [provider]    OB History  Gravida Para Term Preterm AB Living  3 0 0 0 2 0  SAB IAB Ectopic Multiple Live Births  0 1 0 0 0    # Outcome Date GA Lbr Len/2nd Weight Sex Type Anes PTL Lv  3 Current           2 AB           1 IAB              Social History: She  reports that she has never smoked. She has never used smokeless tobacco. She reports that she does not drink alcohol and does not use drugs.  Family History: family history includes Healthy in her father; Scoliosis in her mother.   Review of Systems: A full review of systems was performed and negative except as noted in the HPI.     Physical Exam:  Vital Signs: Ht 5' 5 (1.651 m)   Wt 53.5 kg   LMP 03/11/2023 (Exact Date)   BMI  19.64 kg/m   General: no acute distress.  HEENT: normocephalic, atraumatic Heart: regular rate & rhythm Lungs: normal respiratory effort Abdomen: soft, gravid, non-tender;  EFW: 5 1/2-6 lbs  Pelvic:   External: Normal external female genitalia  Cervix: Dilation: 4 / Effacement (%): 90 / Station: -1    Extremities: non-tender, symmetric, mild edema bilaterally.  DTRs: 2+/2+  Neurologic: Alert & oriented x 3.    Results for orders placed or performed during the hospital encounter of 12/05/23 (from the past 24 hours)  CBC     Status: Abnormal   Collection Time: 12/05/23  5:23 PM  Result Value  Ref Range   WBC 10.6 (H) 4.0 - 10.5 K/uL   RBC 4.01 3.87 - 5.11 MIL/uL   Hemoglobin 12.6 12.0 - 15.0 g/dL   HCT 64.0 (L) 63.9 - 53.9 %   MCV 89.5 80.0 - 100.0 fL   MCH 31.4 26.0 - 34.0 pg   MCHC 35.1 30.0 - 36.0 g/dL   RDW 87.1 88.4 - 84.4 %   Platelets 148 (L) 150 - 400 K/uL   nRBC 0.0 0.0 - 0.2 %  Chlamydia/NGC rt PCR ([facility] only)     Status: None   Collection Time: 12/05/23  5:23 PM   Specimen: Urine; GU  Result Value Ref Range   Specimen source GC/Chlam URINE, RANDOM    Chlamydia Tr NOT DETECTED NOT DETECTED   N gonorrhoeae NOT DETECTED NOT DETECTED  Type and screen [facility]     Status: None   Collection Time: 12/05/23  5:23 PM  Result Value Ref Range   ABO/RH(D) A POS    Antibody Screen NEG    Sample Expiration      12/08/2023,2359 Performed at [facility], [address] [place], [place] [address]   Urine Drug Screen, Qualitative ([facility] only)     Status: None   Collection Time: 12/05/23  5:23 PM  Result Value Ref Range   Tricyclic, Ur Screen NONE DETECTED NONE DETECTED   Amphetamines, Ur Screen NONE DETECTED NONE DETECTED   MDMA (Ecstasy)Ur Screen NONE DETECTED NONE DETECTED   Cocaine Metabolite,Ur [place] NONE DETECTED NONE DETECTED   Opiate, Ur Screen NONE DETECTED NONE DETECTED   Phencyclidine (PCP) Ur S NONE DETECTED NONE DETECTED   Cannabinoid 50 Ng, Ur [place] NONE DETECTED NONE DETECTED   Barbiturates, Ur Screen NONE DETECTED NONE DETECTED   Benzodiazepine, Ur Scrn NONE DETECTED NONE DETECTED   Methadone Scn, Ur NONE DETECTED NONE DETECTED  Group B strep by PCR     Status: Abnormal   Collection Time: 12/05/23  5:23 PM   Specimen: Vaginal/Rectal; Genital  Result Value Ref Range   Group B strep by PCR POSITIVE (A) PRESUMPTIVE NEGATIVE  ABO/Rh     Status: None (Preliminary result)   Collection Time: 12/05/23  6:31 PM  Result Value Ref Range   ABO/RH(D) PENDING     Pertinent Results:  Prenatal Labs: Blood type/Rh A positive    Antibody screen Negative    Rubella Immune    Varicella Immune  RPR NR    HBsAg Neg   Hep C NR   HIV Neg    GC neg  Chlamydia neg  Genetic screening cfDNA negative   1 hour GTT 112  3 hour GTT N/A  GBS POSITIVE/-- (08/31 1723)    FHT:  FHR: 130 bpm, variability: moderate,  accelerations:  Present,  decelerations:  Absent Category/reactivity:  Category I UC:   regular, every 2-4  minutes   Cephalic by [place] and SVE   No results found.  Assessment:  Shiann Kam is a 23 y.o. G36P0020 female at [redacted]w[redacted]d with preterm labor.   Plan:  1. Admit to Labor & Delivery - Admission status: Inpatient - Dr IVAR Dinsmore MD notified of admission and plan of care  - Reason for admission: labor management - consents reviewed and obtained  2. Fetal Well being  - Fetal Tracing: Cat 1 - Group B Streptococcus ppx indicated: GBS positive - Presentation: cephalic confirmed by SVE   3. Routine OB: - Prenatal labs reviewed, as above - Rh positive - CBC, T&S, RPR on admit - Regular diet, continuous IV fluids  4. Monitoring of labor  - Contractions monitored with external toco - Pelvis adequate for trial of labor  - Plan for expectant management  - Augmentation with AROM as appropriate  - Plan for  continuous fetal monitoring - Maternal pain control as desired; planning regional anesthesia and IVPM - Anticipate vaginal delivery  5. Preterm labor  - Frequent contractions continue and cervical change despite IV fluids  - Discussed expectant management  - Can consider augmentation with AROM for advanced dilation or fetal considerations   6. Post Partum Planning: - Infant feeding: formula feeding - Contraception: Nexplanon versus OCP - Flu vaccine: declined  - Tdap vaccine: Given prenatally - RSV vaccine: not in season   Therisa CHRISTELLA Pillow, [place] 12/05/23 7:30 PM  Therisa Pillow, CNM Certified Nurse Midwife [place]  [facility]

## 2023-12-05 NOTE — OB Triage Note (Signed)
 Pt co continuing to spot and having contractions. Reports ctx every 5 minutes. Sabrina Alvarado

## 2023-12-06 ENCOUNTER — Inpatient Hospital Stay: Admitting: Anesthesiology

## 2023-12-06 ENCOUNTER — Encounter: Payer: Self-pay | Admitting: Obstetrics and Gynecology

## 2023-12-06 LAB — RPR: RPR Ser Ql: NONREACTIVE

## 2023-12-06 MED ORDER — FLEET ENEMA RE ENEM: 1.0000 | ENEMA | Freq: Every day | RECTAL | Status: DC | PRN

## 2023-12-06 MED ORDER — LIDOCAINE-EPINEPHRINE (PF) 1.5 %-1:200000 IJ SOLN
INTRAMUSCULAR | Status: DC | PRN
  Administered 2023-12-06: 3 mL via EPIDURAL

## 2023-12-06 MED ORDER — TERBUTALINE SULFATE 1 MG/ML IJ SOLN: 0.2500 mg | Freq: Once | INTRAMUSCULAR | Status: DC | PRN

## 2023-12-06 MED ORDER — COCONUT OIL OIL: 1.0000 | TOPICAL_OIL | Status: DC | PRN

## 2023-12-06 MED ORDER — PHENYLEPHRINE 80 MCG/ML (10ML) SYRINGE FOR IV PUSH (FOR BLOOD PRESSURE SUPPORT): 80.0000 ug | PREFILLED_SYRINGE | INTRAVENOUS | Status: DC | PRN

## 2023-12-06 MED ORDER — LIDOCAINE HCL (PF) 1 % IJ SOLN
INTRAMUSCULAR | Status: DC | PRN
  Administered 2023-12-06: 2 mL via SUBCUTANEOUS

## 2023-12-06 MED ORDER — FENTANYL-BUPIVACAINE-NACL 0.5-0.125-0.9 MG/250ML-% EP SOLN
12.0000 mL/h | EPIDURAL | Status: DC | PRN
  Administered 2023-12-06: 12 mL/h via EPIDURAL

## 2023-12-06 MED ORDER — DIPHENHYDRAMINE HCL 50 MG/ML IJ SOLN: 12.5000 mg | INTRAMUSCULAR | Status: DC | PRN

## 2023-12-06 MED ORDER — FENTANYL-BUPIVACAINE-NACL 0.5-0.125-0.9 MG/250ML-% EP SOLN
EPIDURAL | Status: AC
  Filled 2023-12-06: qty 250

## 2023-12-06 MED ORDER — SODIUM CHLORIDE 0.9 % IV SOLN
INTRAVENOUS | Status: DC | PRN
  Administered 2023-12-06 (×2): 4 mL via EPIDURAL

## 2023-12-06 MED ORDER — ZOLPIDEM TARTRATE 5 MG PO TABS: 5.0000 mg | ORAL_TABLET | Freq: Every evening | ORAL | Status: DC | PRN

## 2023-12-06 MED ORDER — DIPHENHYDRAMINE HCL 50 MG/ML IJ SOLN
50.0000 mg | Freq: Once | INTRAMUSCULAR | Status: AC
  Administered 2023-12-06: 50 mg via INTRAVENOUS

## 2023-12-06 MED ORDER — OXYTOCIN-SODIUM CHLORIDE 30-0.9 UT/500ML-% IV SOLN
1.0000 m[IU]/min | INTRAVENOUS | Status: DC
  Administered 2023-12-06: 2 m[IU]/min via INTRAVENOUS
  Filled 2023-12-06: qty 500

## 2023-12-06 MED ORDER — DIPHENHYDRAMINE HCL 25 MG PO CAPS: 25.0000 mg | ORAL_CAPSULE | Freq: Four times a day (QID) | ORAL | Status: DC | PRN

## 2023-12-06 MED ORDER — OXYCODONE HCL 5 MG PO TABS: 5.0000 mg | ORAL_TABLET | ORAL | Status: DC | PRN

## 2023-12-06 MED ORDER — EPHEDRINE 5 MG/ML INJ: 10.0000 mg | INTRAVENOUS | Status: DC | PRN

## 2023-12-06 MED ORDER — BISACODYL 10 MG RE SUPP: 10.0000 mg | Freq: Every day | RECTAL | Status: DC | PRN

## 2023-12-06 MED ORDER — PROPRANOLOL HCL 1 MG/ML IV SOLN
2.0000 mg | Freq: Once | INTRAVENOUS | Status: AC
  Administered 2023-12-06: 2 mg via INTRAVENOUS
  Filled 2023-12-06: qty 2

## 2023-12-06 MED ORDER — IBUPROFEN 600 MG PO TABS
600.0000 mg | ORAL_TABLET | Freq: Four times a day (QID) | ORAL | Status: DC
  Administered 2023-12-07: 600 mg via ORAL
  Filled 2023-12-06: qty 1

## 2023-12-06 MED ORDER — PRENATAL MULTIVITAMIN CH
1.0000 | ORAL_TABLET | Freq: Every day | ORAL | Status: DC
  Administered 2023-12-07: 1 via ORAL
  Filled 2023-12-06 (×2): qty 1

## 2023-12-06 MED ORDER — DIPHENHYDRAMINE HCL 50 MG/ML IJ SOLN
INTRAMUSCULAR | Status: AC
  Filled 2023-12-06: qty 1

## 2023-12-06 MED ORDER — SENNOSIDES-DOCUSATE SODIUM 8.6-50 MG PO TABS: 2.0000 | ORAL_TABLET | Freq: Every day | ORAL | Status: DC

## 2023-12-06 MED ORDER — SIMETHICONE 80 MG PO CHEW: 80.0000 mg | CHEWABLE_TABLET | ORAL | Status: DC | PRN

## 2023-12-06 MED ORDER — ACETAMINOPHEN 325 MG PO TABS: 650.0000 mg | ORAL_TABLET | ORAL | Status: DC | PRN

## 2023-12-06 MED ORDER — ONDANSETRON HCL 4 MG/2ML IJ SOLN: 4.0000 mg | INTRAMUSCULAR | Status: DC | PRN

## 2023-12-06 MED ORDER — CALCIUM CARBONATE ANTACID 500 MG PO CHEW: 2.0000 | CHEWABLE_TABLET | Freq: Once | ORAL | Status: DC

## 2023-12-06 MED ORDER — SODIUM CHLORIDE 0.9% FLUSH: 3.0000 mL | INTRAVENOUS | Status: DC | PRN

## 2023-12-06 MED ORDER — ONDANSETRON HCL 4 MG PO TABS: 4.0000 mg | ORAL_TABLET | ORAL | Status: DC | PRN

## 2023-12-06 MED ORDER — SODIUM CHLORIDE 0.9 % IV SOLN: 250.0000 mL | INTRAVENOUS | Status: DC | PRN

## 2023-12-06 MED ORDER — LACTATED RINGERS IV SOLN: 500.0000 mL | Freq: Once | INTRAVENOUS | Status: DC

## 2023-12-06 MED ORDER — SODIUM CHLORIDE 0.9% FLUSH
3.0000 mL | Freq: Two times a day (BID) | INTRAVENOUS | Status: DC
  Administered 2023-12-07 (×2): 3 mL via INTRAVENOUS

## 2023-12-06 MED ORDER — BENZOCAINE-MENTHOL 20-0.5 % EX AERO
1.0000 | INHALATION_SPRAY | CUTANEOUS | Status: DC | PRN
  Filled 2023-12-06: qty 56

## 2023-12-06 NOTE — Anesthesia Preprocedure Evaluation (Signed)
 Anesthesia Evaluation  Patient identified by MRN, date of birth, ID band Patient awake    Reviewed: Allergy & Precautions, H&P , NPO status , Patient's Chart, lab work & pertinent test results, reviewed documented beta blocker date and time   History of Anesthesia Complications Negative for: history of anesthetic complications  Airway Mallampati: II  TM Distance: >3 FB Neck ROM: full    Dental no notable dental hx.    Pulmonary neg pulmonary ROS   Pulmonary exam normal breath sounds clear to auscultation       Cardiovascular Exercise Tolerance: Good negative cardio ROS Normal cardiovascular exam Rhythm:regular Rate:Normal     Neuro/Psych negative neurological ROS  negative psych ROS   GI/Hepatic Neg liver ROS,GERD  ,,  Endo/Other  negative endocrine ROS    Renal/GU negative Renal ROS  negative genitourinary   Musculoskeletal   Abdominal   Peds  Hematology negative hematology ROS (+)   Anesthesia Other Findings Past Medical History: No date: Medical history non-contributory   Reproductive/Obstetrics (+) Pregnancy                              Anesthesia Physical Anesthesia Plan  ASA: 2  Anesthesia Plan: Epidural   Post-op Pain Management:    Induction:   PONV Risk Score and Plan:   Airway Management Planned:   Additional Equipment:   Intra-op Plan:   Post-operative Plan:   Informed Consent: I have reviewed the patients History and Physical, chart, labs and discussed the procedure including the risks, benefits and alternatives for the proposed anesthesia with the patient or authorized representative who has indicated his/her understanding and acceptance.     Dental Advisory Given  Plan Discussed with: Anesthesiologist, CRNA and Surgeon  Anesthesia Plan Comments:          Anesthesia Quick Evaluation

## 2023-12-06 NOTE — Progress Notes (Signed)
 L&D Note    Subjective:  Feeling contraction pain on her left side  Objective:      12/06/2023    9:45 AM 12/06/2023    8:45 AM 12/06/2023    8:44 AM  Vitals with BMI  Systolic 105 105 894  Diastolic 60 58 58  Pulse 101 96 96     Gen: alert, cooperative, no distress FHR: Baseline: 150 bpm, Variability: moderate, Accels: Present, Decels: intermittent variables Toco: irregular, every 1.5-5 minutes SVE: Dilation: 9 Effacement (%): 90 Cervical Position: Middle Station: Plus 1 Presentation: Vertex Exam by:: PHEBE Brunswick CNM  Medications SCHEDULED MEDICATIONS   calcium  carbonate  2 tablet Oral Once   diphenhydrAMINE   50 mg Intravenous Once   oxytocin  40 units in LR 1000 mL  333 mL Intravenous Once   sodium chloride  flush  3 mL Intravenous Q12H    MEDICATION INFUSIONS   sodium chloride      fentaNYL  2 mcg/mL w/bupivacaine  0.125% in NS 250 mL 12 mL/hr (12/06/23 0232)   lactated ringers      lactated ringers  500 mL (12/06/23 0222)   lactated ringers  125 mL/hr at 12/05/23 1800   lactated ringers  125 mL/hr at 12/06/23 0903   oxytocin      oxytocin  4 milli-units/min (12/06/23 1315)   pencillin G potassium IV 3 Million Units (12/06/23 0903)    PRN MEDICATIONS  sodium chloride , acetaminophen , acetaminophen , calcium  carbonate, ePHEDrine , ePHEDrine , fentaNYL  (SUBLIMAZE ) injection, fentaNYL  2 mcg/mL w/bupivacaine  0.125% in NS 250 mL, lactated ringers , lidocaine  (PF), ondansetron , phenylephrine , phenylephrine , sodium chloride  flush, sodium citrate-citric acid , terbutaline    Assessment & Plan:  23 y.o. G3P0020 at [redacted]w[redacted]d admitted for active labor -GBS: positive -IP Antibiotics: abx: PCN x 2 doses -Membranes ruptured, clear fluid -Recheck:Evaluated by digital exam. -Preeclampsia:  labs stable -Pain: level of pain (1-10, 10 severe), 7 -Anticipate vaginal delivery. and Intervention: increase Pitocin  rate, change maternal position, and anticipate vaginal delivery -Pe_uterus_labor: Pitocin  at  4 mu/min. -Analgesia: regional anesthesia   Sabrina Alvarado, CNM  12/06/2023 1:43 PM  [facility]

## 2023-12-06 NOTE — Progress Notes (Signed)
 L&D Note    Subjective:  Resting in bed with significant other at bedside  Objective:      12/06/2023    9:45 AM 12/06/2023    8:45 AM 12/06/2023    8:44 AM  Vitals with BMI  Systolic 105 105 894  Diastolic 60 58 58  Pulse 101 96 96     Gen: alert, cooperative, no distress FHR: Baseline: 150 bpm, Variability: moderate, Accels: Present, Decels: variable Toco: irregular, every 2-5.5 minutes SVE: Dilation: 7.5 Effacement (%): 90 Cervical Position: Middle Station: 0 Presentation: Vertex Exam by:: Aisha, CNM  Medications SCHEDULED MEDICATIONS   oxytocin  40 units in LR 1000 mL  333 mL Intravenous Once   sodium chloride  flush  3 mL Intravenous Q12H    MEDICATION INFUSIONS   sodium chloride      fentaNYL  2 mcg/mL w/bupivacaine  0.125% in NS 250 mL 12 mL/hr (12/06/23 0232)   lactated ringers      lactated ringers  500 mL (12/06/23 0222)   lactated ringers  125 mL/hr at 12/05/23 1800   lactated ringers  125 mL/hr at 12/06/23 0903   oxytocin      oxytocin  4 milli-units/min (12/06/23 1315)   pencillin G potassium IV 3 Million Units (12/06/23 0903)    PRN MEDICATIONS  sodium chloride , acetaminophen , acetaminophen , calcium  carbonate, diphenhydrAMINE , ePHEDrine , ePHEDrine , fentaNYL  (SUBLIMAZE ) injection, fentaNYL  2 mcg/mL w/bupivacaine  0.125% in NS 250 mL, lactated ringers , lidocaine  (PF), ondansetron , phenylephrine , phenylephrine , sodium chloride  flush, sodium citrate-citric acid , terbutaline    Assessment & Plan:  23 y.o. G3P0020 at [redacted]w[redacted]d admitted for active labor -GBS: positive -IP Antibiotics: abx: PCN x 2 doses -Membranes ruptured, clear fluid -Recheck:Evaluated by digital exam. -Preeclampsia:  labs stable -Pain: none -Intervention: IV Pitocin  augmentation, AROM, change maternal position, anticipate vaginal delivery, place IUPC, and FSE -Pe_uterus_labor: Adequate relaxation between contractions. -Analgesia: regional anesthesia   Sabrina Alvarado, CNM  12/06/2023 1:26 PM   Sabrina Alvarado

## 2023-12-06 NOTE — Progress Notes (Signed)
 Patient having deep recurrent variables, but is now complete and being prepped for pushing. Dr Lovetta updated  Bobbette Brunswick CNM

## 2023-12-06 NOTE — Discharge Summary (Signed)
 Postpartum Discharge Summary  Patient Name: Sabrina Alvarado DOB: 2000/09/04 MRN: [id number]  Date of admission: 12/05/2023 Delivery date:12/06/2023 Delivering provider: Allianna Beaubien Date of discharge: 12/08/2023  Primary OB: [facility] OFE:Ejupzwu'd last menstrual period was 03/11/2023 (exact date). EDC Estimated Date of Delivery: 01/01/24 Gestational Age at Delivery: [redacted]w[redacted]d   Admitting diagnosis: Preterm contractions [O47.00] Preterm labor [O60.00] Intrauterine pregnancy: [redacted]w[redacted]d     Secondary diagnosis:   Principal Problem:   NSVD (normal spontaneous vaginal delivery) Active Problems:   Preterm contractions   Preterm labor   Discharge Diagnosis: Preterm Pregnancy Delivered      Hospital course: Onset of Labor With Vaginal Delivery      23 y.o. yo H6E9878 at [redacted]w[redacted]d was admitted in Latent Labor on 12/05/2023. Labor course was complicated by an intermittent category 2 FHT  Membrane Rupture Time/Date: 11:10 AM,12/06/2023  Delivery Method:Vaginal, Spontaneous Operative Delivery:N/A Episiotomy: None Lacerations:  1st degree;Periurethral Patient had a postpartum course complicated by none.  She is ambulating, tolerating a regular diet, passing flatus, and urinating well. Patient is discharged home in stable condition on 12/08/23.  Newborn Data: Birth date:12/06/2023 Birth time:5:02 PM Gender:Female Living status:Living Apgars:8 ,9  Weight:2760 g                                            Post partum procedures:none Augmentation:: AROM and Pitocin  Complications: None Delivery Type: spontaneous vaginal delivery Anesthesia: epidural anesthesia Placenta: spontaneous To Pathology: No   Prenatal Labs:   Blood type/Rh A positive   Antibody screen Negative    Rubella Immune    Varicella Immune  RPR NR    HBsAg Neg   Hep C NR   HIV Neg    GC neg  Chlamydia neg  Genetic screening cfDNA negative   1 hour GTT 112  3 hour GTT N/A  GBS POSITIVE/-- (08/31 1723)       Magnesium Sulfate received: No BMZ received: No Rhophylac:was not indicated MMR: was not indicated Varivax vaccine given: was not indicated Tdap vaccine: Given prenatally Flu vaccine: declined RSV vaccine:not in season  Transfusion:No  Physical exam  Vitals:   12/07/23 1216 12/07/23 1543 12/07/23 2304 12/08/23 0818  BP: 106/67 130/85 116/71 110/70  Pulse: 78 86 64 73  Resp: 18 16 18 18   Temp: 98.2 F (36.8 C) 97.9 F (36.6 C) (!) 97.5 F (36.4 C) 98.3 F (36.8 C)  TempSrc: Oral Oral Oral Oral  SpO2:   100% 100%  Weight:      Height:       General: alert, cooperative, and no distress Lochia: appropriate Uterine Fundus: firm Perineum:minimal edema/repair well approximated Incision: n/a DVT Evaluation: No evidence of DVT seen on physical exam.  Labs: Lab Results  Component Value Date   WBC 12.8 (H) 12/07/2023   HGB 11.4 (L) 12/07/2023   HCT 34.0 (L) 12/07/2023   MCV 92.9 12/07/2023   PLT 153 12/07/2023      Latest Ref Rng & Units 02/14/2023   12:14 PM  CMP  Glucose 70 - 99 mg/dL 882   BUN 6 - 20 mg/dL 17   Creatinine 9.55 - 1.00 mg/dL 9.33   Sodium 864 - 854 mmol/L 134   Potassium 3.5 - 5.1 mmol/L 3.8   Chloride 98 - 111 mmol/L 101   CO2 22 - 32 mmol/L 25   Calcium  8.9 - 10.3 mg/dL 9.1  Edinburgh Score:    12/07/2023    4:00 PM  Edinburgh Postnatal Depression Scale Screening Tool  I have been able to laugh and see the funny side of things. 0  I have looked forward with enjoyment to things. 0  I have blamed myself unnecessarily when things went wrong. 0  I have been anxious or worried for no good reason. 0  I have felt scared or panicky for no good reason. 0  Things have been getting on top of me. 0  I have been so unhappy that I have had difficulty sleeping. 0  I have felt sad or miserable. 0  I have been so unhappy that I have been crying. 0  The thought of harming myself has occurred to me. 0  Edinburgh Postnatal Depression Scale Total 0      Postpartum VTE Prophylaxis  Recommend 6 weeks of prophylactic anticoagulation with LMWH or subcutaneous unfractionated heparin if 1 or more high risk factor is present.  Recommend 14 days of prophylactic anticoagulation with LMWH or subcutaneous unfractionated heparin if 3 or more moderate risk factors are present.   Risk assessment for postpartum VTE and prophylactic treatment: High risk factors: None Moderate risk factors: None  Postpartum VTE prophylaxis with LMWH not indicated    After visit meds:  Allergies as of 12/08/2023   No Known Allergies      Medication List     STOP taking these medications    baclofen  10 MG tablet Commonly known as: LIORESAL    ibuprofen  600 MG tablet Commonly known as: ADVIL  Replaced by: ibuprofen  100 MG/5ML suspension   norethindrone-ethinyl estradiol 1-20 MG-MCG tablet Commonly known as: LOESTRIN       TAKE these medications    acetaminophen  160 MG/5ML solution Commonly known as: TYLENOL  Take 20.3 mLs (650 mg total) by mouth every 6 (six) hours as needed.   ibuprofen  100 MG/5ML suspension Commonly known as: ADVIL  Take 30 mLs (600 mg total) by mouth every 6 (six) hours as needed. Replaces: ibuprofen  600 MG tablet   multivitamin-prenatal 27-0.8 MG Tabs tablet Take 1 tablet by mouth daily at 12 noon.       Discharge home in stable condition Infant Feeding: Bottle Infant Disposition:home with mother Discharge instruction: per After Visit Summary and Postpartum booklet. Activity: Advance as tolerated. Pelvic rest for 6 weeks.  Diet: routine diet Anticipated Birth Control:  considering Contraceptives: Combination OCPs and Nexplanon Postpartum Appointment:6 weeks Additional Postpartum F/U: none Future Appointments:No future appointments. Follow up Visit:  Follow-up Information     Aisha Heller, CNM Follow up in 6 week(s).   Specialty: Obstetrics Why: pp vacination Contact information: [address] [place] [address] [phone]                 Plan:  Nyiesha Beever was discharged to home in good condition. Follow-up appointment as directed.    Signed: Jenifer E Oxley 12/08/2023 1:20 PM

## 2023-12-06 NOTE — Anesthesia Procedure Notes (Signed)
 Epidural Patient location during procedure: OB Start time: 12/06/2023 2:15 AM End time: 12/06/2023 2:18 AM  Staffing Anesthesiologist: Dario Barter, MD Performed: anesthesiologist   Preanesthetic Checklist Completed: patient identified, IV checked, site marked, risks and benefits discussed, surgical consent, monitors and equipment checked, pre-op evaluation and timeout performed  Epidural Patient position: sitting Prep: ChloraPrep Patient monitoring: heart rate, continuous pulse ox and blood pressure Approach: midline Location: L3-L4 Injection technique: LOR saline  Needle:  Needle type: Tuohy  Needle gauge: 17 G Needle length: 9 cm Needle insertion depth: 3 cm Catheter type: closed end flexible Catheter size: 19 Gauge Catheter at skin depth: 8 cm Test dose: negative and 1.5% lidocaine  with Epi 1:200 K  Assessment Sensory level: T10 Events: blood not aspirated, no cerebrospinal fluid, injection not painful, no injection resistance, no paresthesia and negative IV test  Additional Notes 1st attempt Pt. Evaluated and documentation done after procedure finished. Patient identified. Risks/Benefits/Options discussed with patient including but not limited to bleeding, infection, nerve damage, paralysis, failed block, incomplete pain control, headache, blood pressure changes, nausea, vomiting, reactions to medication both or allergic, itching and postpartum back pain. Confirmed with bedside nurse the patient's most recent platelet count. Confirmed with patient that they are not currently taking any anticoagulation, have any bleeding history or any family history of bleeding disorders. Patient expressed understanding and wished to proceed. All questions were answered. Sterile technique was used throughout the entire procedure. Please see nursing notes for vital signs. Test dose was given through epidural catheter and negative prior to continuing to dose epidural or start infusion. Warning  signs of high block given to the patient including shortness of breath, tingling/numbness in hands, complete motor block, or any concerning symptoms with instructions to call for help. Patient was given instructions on fall risk and not to get out of bed. All questions and concerns addressed with instructions to call with any issues or inadequate analgesia.    Patient tolerated the insertion well without immediate complications.Reason for block:procedure for pain

## 2023-12-06 NOTE — Progress Notes (Signed)
 L&D Note    Subjective:  Comfortable with epidural  Objective:    Current Vital Signs 24h Vital Sign Ranges  T 98.1 F (36.7 C) Temp  Avg: 98.3 F (36.8 C)  Min: 98.1 F (36.7 C)  Max: 98.4 F (36.9 C)  BP 106/60 BP  Min: 96/67  Max: 128/79  HR (!) 106 Pulse  Avg: 93.6  Min: 80  Max: 106  RR 16 Resp  Avg: 16.7  Min: 16  Max: 18  SaO2 100 %   SpO2  Avg: 98.6 %  Min: 94 %  Max: 100 %      Gen: alert, cooperative, no distress FHR: Baseline: 145 bpm, Variability: moderate, Accels: Present, Decels: late - resolved with position change  Toco: regular, every 3-5 minutes SVE: Dilation: 6 Effacement (%): 90 Cervical Position: Middle Station: -1 Presentation: Vertex Exam by:: Vernel, CNM  Medications SCHEDULED MEDICATIONS   oxytocin  40 units in LR 1000 mL  333 mL Intravenous Once   sodium chloride  flush  3 mL Intravenous Q12H    MEDICATION INFUSIONS   sodium chloride      fentaNYL  2 mcg/mL w/bupivacaine  0.125% in NS 250 mL 12 mL/hr (12/06/23 0232)   lactated ringers      lactated ringers  500 mL (12/06/23 0222)   lactated ringers  125 mL/hr at 12/05/23 1800   lactated ringers  125 mL/hr at 12/06/23 0251   oxytocin      pencillin G potassium IV 3 Million Units (12/06/23 0453)    PRN MEDICATIONS  sodium chloride , acetaminophen , acetaminophen , calcium  carbonate, diphenhydrAMINE , ePHEDrine , ePHEDrine , fentaNYL  (SUBLIMAZE ) injection, fentaNYL  2 mcg/mL w/bupivacaine  0.125% in NS 250 mL, lactated ringers , lidocaine  (PF), ondansetron , phenylephrine , phenylephrine , sodium chloride  flush, sodium citrate-citric acid    Assessment & Plan:  23 y.o. G3P0020 at [redacted]w[redacted]d admitted for preterm labor  -Labor: Prolonged active phase labor. No cervical change from previous exam.  -Fetal Well-being: Category II -> Category I  - Late decelerations resolved with position change  -GBS: positive - PCN x 2 -Membranes intact -Intervention: dicussed AROM for augmentation. Heiley desires to wait for AROM and  consider her options. She will let us  know if she's amenable to AROM -Analgesia: regional anesthesia   Therisa CHRISTELLA Vernel, CNM  12/06/2023 8:04 AM  [facility]

## 2023-12-06 NOTE — Progress Notes (Signed)
 L&D Note    Subjective:  Coping well with contractions   Objective:    Current Vital Signs 24h Vital Sign Ranges  T 98.1 F (36.7 C) Temp  Avg: 98.3 F (36.8 C)  Min: 98.1 F (36.7 C)  Max: 98.4 F (36.9 C)  BP 128/79 BP  Min: 120/70  Max: 128/79  HR 89 Pulse  Avg: 92  Min: 89  Max: 95  RR 18 Resp  Avg: 17  Min: 16  Max: 18  SaO2     No data recorded      FHR: Baseline: 135 bpm, Variability: moderate, Accels: Present, Decels: none Toco: regular, every 3-5 minutes SVE: Dilation: 6 Effacement (%): 90 Cervical Position: Middle Station: -1 Presentation: Vertex Exam by:: Powell Sprague, RN  Medications SCHEDULED MEDICATIONS   ammonia        lidocaine  (PF)       misoprostol        oxytocin        oxytocin  40 units in LR 1000 mL  333 mL Intravenous Once   sodium chloride  flush  3 mL Intravenous Q12H    MEDICATION INFUSIONS   sodium chloride      lactated ringers      lactated ringers  125 mL/hr at 12/05/23 1800   lactated ringers      oxytocin      pencillin G potassium IV      PRN MEDICATIONS  sodium chloride , acetaminophen , acetaminophen , ammonia , calcium  carbonate, fentaNYL  (SUBLIMAZE ) injection, lactated ringers , lidocaine  (PF), lidocaine  (PF), misoprostol , ondansetron , oxytocin , sodium chloride  flush, sodium citrate-citric acid    Assessment & Plan:  23 y.o. H6E9979 at [redacted]w[redacted]d admitted for preterm labor  -Labor: Active phase labor. -Fetal Well-being: Category I -GBS: positive - PCN x 1, 2nd dose due at 0030 -Membranes intact -Continue present management. -Analgesia: IVPM   Therisa CHRISTELLA Pillow, CNM  12/06/2023 12:04 AM  [facility]

## 2023-12-07 LAB — CBC
HCT: 34 % — ABNORMAL LOW (ref 36.0–46.0)
Hemoglobin: 11.4 g/dL — ABNORMAL LOW (ref 12.0–15.0)
MCH: 31.1 pg (ref 26.0–34.0)
MCHC: 33.5 g/dL (ref 30.0–36.0)
MCV: 92.9 fL (ref 80.0–100.0)
Platelets: 153 K/uL (ref 150–400)
RBC: 3.66 MIL/uL — ABNORMAL LOW (ref 3.87–5.11)
RDW: 12.9 % (ref 11.5–15.5)
WBC: 12.8 K/uL — ABNORMAL HIGH (ref 4.0–10.5)
nRBC: 0 % (ref 0.0–0.2)

## 2023-12-07 MED ORDER — DOCUSATE SODIUM 50 MG/5ML PO LIQD
100.0000 mg | Freq: Two times a day (BID) | ORAL | Status: DC
  Administered 2023-12-07 – 2023-12-08 (×2): 100 mg via ORAL
  Filled 2023-12-07 (×4): qty 10

## 2023-12-07 MED ORDER — IBUPROFEN 100 MG/5ML PO SUSP
600.0000 mg | Freq: Four times a day (QID) | ORAL | Status: DC
  Administered 2023-12-08: 600 mg via ORAL
  Filled 2023-12-07 (×11): qty 30

## 2023-12-07 MED ORDER — ACETAMINOPHEN 160 MG/5ML PO SOLN
650.0000 mg | Freq: Four times a day (QID) | ORAL | Status: DC
  Administered 2023-12-07 – 2023-12-08 (×4): 650 mg via ORAL
  Filled 2023-12-07 (×7): qty 20.3

## 2023-12-07 NOTE — Anesthesia Postprocedure Evaluation (Signed)
 Anesthesia Post Note  Patient: Sabrina Alvarado  Procedure(s) Performed: AN AD HOC LABOR EPIDURAL  Patient location during evaluation: Mother Baby Anesthesia Type: Epidural Level of consciousness: awake Pain management: pain level controlled Respiratory status: spontaneous breathing Cardiovascular status: stable Postop Assessment: no headache Anesthetic complications: no   No notable events documented.   Last Vitals:  Vitals:   12/07/23 0320 12/07/23 0737  BP: 106/67 112/67  Pulse: 85 86  Resp: 18 18  Temp: 36.9 C 36.7 C  SpO2: 97% 98%    Last Pain:  Vitals:   12/07/23 0737  TempSrc: Oral  PainSc:                  Shona Earnie Fare

## 2023-12-07 NOTE — Progress Notes (Signed)
 Post Partum Day 1 Subjective: Doing well, no complaints.  Tolerating regular diet, pain with PO meds, voiding and ambulating without difficulty.  No CP SOB Fever,Chills, N/V or leg pain; denies nipple or breast pain, no HA change of vision, RUQ/epigastric pain  Objective: BP 112/67 (BP Location: Right Arm)   Pulse 86   Temp 98 F (36.7 C) (Oral)   Resp 18   Ht 5' 5 (1.651 m)   Wt 53.5 kg   LMP 03/11/2023 (Exact Date)   SpO2 98%   Breastfeeding Unknown   BMI 19.64 kg/m    Physical Exam:  General: NAD Breasts: soft/nontender CV: RRR Pulm: nl effort, CTABL Abdomen: soft, NT, BS x 4 Perineum: minimal edema, repair well approximated Lochia: moderate Uterine Fundus: fundus firm and 1 fb below umbilicus DVT Evaluation: no cords, ttp LEs   Recent Labs    12/05/23 1723 12/07/23 0537  HGB 12.6 11.4*  HCT 35.9* 34.0*  WBC 10.6* 12.8*  PLT 148* 153    Assessment/Plan: 23 y.o. H6E9878 postpartum day # 1  - Continue routine PP care - encouraged snug fitting bra and cabbage leaves for bottlefeeding.  - Discussed contraceptive options including implant, IUDs hormonal and non-hormonal, injection, pills/ring/patch, condoms, and NFP.  - Acute blood loss anemia, clinically insignificant - hemoglobin changed from 12.6 to 11.4, patient is asymptomatic, hemodynamically stable; start po ferrous sulfate BID with stool softeners - Immunization status: all Imms up to date - Tylenol , Ibuprofen , and stool softener changed to liquid versions per patient's request  Disposition: Does not desire Dc home today.   Edsel Charlies Blush, CNM 12/07/2023 11:16 AM

## 2023-12-08 MED ORDER — IBUPROFEN 100 MG/5ML PO SUSP: 600.0000 mg | Freq: Four times a day (QID) | ORAL | Status: AC | PRN

## 2023-12-08 MED ORDER — ACETAMINOPHEN 160 MG/5ML PO SOLN: 650.0000 mg | Freq: Four times a day (QID) | ORAL | Status: AC | PRN

## 2023-12-08 NOTE — Discharge Instructions (Signed)

## 2023-12-08 NOTE — Progress Notes (Signed)
 Patient discharged. Rooming in with infant. Discharge instructions given. Patient verbalized understanding.
